# Patient Record
Sex: Female | Born: 1945 | Race: Black or African American | Hispanic: No
Health system: Southern US, Community
[De-identification: ages and names within clinical notes are randomized; demographics above are authoritative.]

---

## 2015-07-29 ENCOUNTER — Emergency Department
Admission: EM | Admit: 2015-07-29 | Discharge: 2015-07-29 | Disposition: A | Discharge status: Home / Self Care | Payer: Medicare Other | Attending: Emergency Medicine | Admitting: Emergency Medicine

## 2015-07-29 ENCOUNTER — Emergency Department: Payer: Medicare Other

## 2015-07-29 DIAGNOSIS — Y999 Unspecified external cause status: Secondary | ICD-10-CM | POA: Insufficient documentation

## 2015-07-29 DIAGNOSIS — W108XXA Fall (on) (from) other stairs and steps, initial encounter: Secondary | ICD-10-CM | POA: Insufficient documentation

## 2015-07-29 DIAGNOSIS — S20211A Contusion of right front wall of thorax, initial encounter: Secondary | ICD-10-CM | POA: Insufficient documentation

## 2015-07-29 DIAGNOSIS — S20229A Contusion of unspecified back wall of thorax, initial encounter: Secondary | ICD-10-CM | POA: Insufficient documentation

## 2015-07-29 DIAGNOSIS — S299XXA Unspecified injury of thorax, initial encounter: Secondary | ICD-10-CM | POA: Diagnosis present

## 2015-07-29 DIAGNOSIS — Y929 Unspecified place or not applicable: Secondary | ICD-10-CM | POA: Insufficient documentation

## 2015-07-29 DIAGNOSIS — S20219A Contusion of unspecified front wall of thorax, initial encounter: Secondary | ICD-10-CM

## 2015-07-29 DIAGNOSIS — S20212A Contusion of left front wall of thorax, initial encounter: Secondary | ICD-10-CM | POA: Diagnosis not present

## 2015-07-29 DIAGNOSIS — Y9301 Activity, walking, marching and hiking: Secondary | ICD-10-CM | POA: Insufficient documentation

## 2015-07-29 MED ORDER — MELOXICAM 7.5 MG PO TABS: 7.5000 mg | ORAL_TABLET | Freq: Every day | ORAL | Status: AC

## 2015-07-29 MED ORDER — METHOCARBAMOL 500 MG PO TABS: 500.0000 mg | ORAL_TABLET | Freq: Four times a day (QID) | ORAL | Status: AC | PRN

## 2015-07-29 MED ORDER — HYDROCODONE-ACETAMINOPHEN 5-325 MG PO TABS
1.0000 | ORAL_TABLET | Freq: Once | ORAL | Status: AC
  Administered 2015-07-29: 1 via ORAL
  Filled 2015-07-29: qty 1

## 2015-07-29 MED ORDER — METHOCARBAMOL 500 MG PO TABS
500.0000 mg | ORAL_TABLET | Freq: Once | ORAL | Status: AC
  Administered 2015-07-29: 500 mg via ORAL
  Filled 2015-07-29: qty 1

## 2015-07-29 MED ORDER — MELOXICAM 7.5 MG PO TABS
7.5000 mg | ORAL_TABLET | Freq: Once | ORAL | Status: AC
  Administered 2015-07-29: 7.5 mg via ORAL
  Filled 2015-07-29: qty 1

## 2015-07-29 NOTE — Discharge Instructions (Signed)
Contusion A contusion is a deep bruise. Contusions happen when an injury causes bleeding under the skin. Symptoms of bruising include pain, swelling, and discolored skin. The skin may turn blue, purple, or yellow. HOME CARE   Rest the injured area.  If told, put ice on the injured area.  Put ice in a plastic bag.  Place a towel between your skin and the bag.  Leave the ice on for 20 minutes, 2-3 times per day.  If told, put light pressure (compression) on the injured area using an elastic bandage. Make sure the bandage is not too tight. Remove it and put it back on as told by your doctor.  If possible, raise (elevate) the injured area above the level of your heart while you are sitting or lying down.  Take over-the-counter and prescription medicines only as told by your doctor. GET HELP IF:  Your symptoms do not get better after several days of treatment.  Your symptoms get worse.  You have trouble moving the injured area. GET HELP RIGHT AWAY IF:   You have very bad pain.  You have a loss of feeling (numbness) in a hand or foot.  Your hand or foot turns pale or cold.   This information is not intended to replace advice given to you by your health care provider. Make sure you discuss any questions you have with your health care provider.   Document Released: 06/30/2007 Document Revised: 10/02/2014 Document Reviewed: 05/29/2014 Elsevier Interactive Patient Education 2016 [employer].  Rib Contusion A rib contusion is a deep bruise on your rib area. Contusions are the result of a blunt trauma that causes bleeding and injury to the tissues under the skin. A rib contusion may involve bruising of the ribs and of the skin and muscles in the area. The skin overlying the contusion may turn blue, purple, or yellow. Minor injuries will give you a painless contusion, but more severe contusions may stay painful and swollen for a few weeks. CAUSES  A contusion is usually caused by a  blow, trauma, or direct force to an area of the body. This often occurs while playing contact sports. SYMPTOMS  Swelling and redness of the injured area.  Discoloration of the injured area.  Tenderness and soreness of the injured area.  Pain with or without movement. DIAGNOSIS  The diagnosis can be made by taking a medical history and performing a physical exam. An X-ray, CT scan, or MRI may be needed to determine if there were any associated injuries, such as broken bones (fractures) or internal injuries. TREATMENT  Often, the best treatment for a rib contusion is rest. Icing or applying cold compresses to the injured area may help reduce swelling and inflammation. Deep breathing exercises may be recommended to reduce the risk of partial lung collapse and pneumonia. Over-the-counter or prescription medicines may also be recommended for pain control. HOME CARE INSTRUCTIONS   Apply ice to the injured area:  Put ice in a plastic bag.  Place a towel between your skin and the bag.  Leave the ice on for 20 minutes, 2-3 times per day.  Take medicines only as directed by your health care provider.  Rest the injured area. Avoid strenuous activity and any activities or movements that cause pain. Be careful during activities and avoid bumping the injured area.  Perform deep-breathing exercises as directed by your health care provider.  Do not lift anything that is heavier than 5 lb (2.3 kg) until your health care  provider approves. °· Do not use any tobacco products, including cigarettes, chewing tobacco, or electronic cigarettes. If you need help quitting, ask your health care provider. °SEEK MEDICAL CARE IF:  °· You have increased bruising or swelling. °· You have pain that is not controlled with treatment. °· You have a fever. °SEEK IMMEDIATE MEDICAL CARE IF:  °· You have difficulty breathing or shortness of breath. °· You develop a continual cough, or you cough up thick or bloody  sputum. °· You feel sick to your stomach (nauseous), you throw up (vomit), or you have abdominal pain. °  °This information is not intended to replace advice given to you by your health care provider. Make sure you discuss any questions you have with your health care provider. °  °Document Released: 10/06/2000 Document Revised: 02/01/2014 Document Reviewed: 10/23/2013 °Elsevier Interactive Patient Education ©2016 [employer] ° °

## 2015-07-29 NOTE — ED Provider Notes (Signed)
[facility] Emergency Department Provider Note  ____________________________________________  Time seen: Approximately 9:06 PM  I have reviewed the triage vital signs and the nursing notes.   HISTORY  Chief Complaint Fall    HPI Tina Potts is a 70 y.o. female who presents to emergency department complaining of back and rib pain status post fall. Patient states that she was walking down a flight of steps when she slipped and fell backwards landing on her back. Patient states that she slipped down approximately 5 steps. Patient was able to rise up and walk after injury. Patient is continuing to have pain in the mid back into the rib region. Patient states it is painful with deep inspiration or movement. Pain is moderate to severe, constant, sharp. Patient has a history of previous back surgeries with hardware in place. Patient did not hit her head or lose consciousness. She denies any headache, visual changes, neck pain, chest pain, shortness of breath, abdominal pain, nausea or vomiting.   No past medical history on file.  There are no active problems to display for this patient.   No past surgical history on file.  Current Outpatient Rx  Name  Route  Sig  Dispense  Refill  . meloxicam (MOBIC) 7.5 MG tablet   Oral   Take 1 tablet (7.5 mg total) by mouth daily.   30 tablet   0   . methocarbamol (ROBAXIN) 500 MG tablet   Oral   Take 1 tablet (500 mg total) by mouth every 6 (six) hours as needed for muscle spasms.   16 tablet   0     Allergies Stadol and Talwin  No family history on file.  Social History Social History  Substance Use Topics  . Smoking status: Not on file  . Smokeless tobacco: Not on file  . Alcohol Use: Not on file     Review of Systems  Constitutional: No fever/chills Cardiovascular: no chest pain. Respiratory: no cough. No SOB. Gastrointestinal: No abdominal pain.  No nausea, no vomiting.   Musculoskeletal:  Positive for back and bilateral rib pain Skin: Negative for rash, abrasions, lacerations, ecchymosis. Neurological: Negative for headaches, focal weakness or numbness. 10-point ROS otherwise negative.  ____________________________________________   PHYSICAL EXAM:  VITAL SIGNS: ED Triage Vitals  Enc Vitals Group     BP 07/29/15 2038 122/56 mmHg     Pulse Rate 07/29/15 2038 97     Resp 07/29/15 2038 18     Temp 07/29/15 2038 99.4 F (37.4 C)     Temp Source 07/29/15 2038 Oral     SpO2 07/29/15 2038 98 %     Weight 07/29/15 2038 217 lb (98.431 kg)     Height 07/29/15 2038 5\' 5"  (1.651 m)     Head Cir --      Peak Flow --      Pain Score 07/29/15 2039 8     Pain Loc --      Pain Edu? --      Excl. in GC? --      Constitutional: Alert and oriented. Well appearing and in no acute distress. Eyes: Conjunctivae are normal. PERRL. EOMI. Head: Atraumatic. Neck: No stridor.  No cervical spine tenderness to palpation.  Cardiovascular: Normal rate, regular rhythm. Normal S1 and S2.  Good peripheral circulation. Respiratory: Normal respiratory effort without tachypnea or retractions. Lungs CTAB. Good air entry to the bases with no decreased or absent breath sounds. Musculoskeletal: Full range of motion to all extremities.  No gross deformities appreciated. No deformities to reopen inspection. No paradoxical chest wall movement. No flail segments. Patient is diffusely tender to palpation in the 6 through 12 rib groups. No palpable abnormality. Patient is tender to palpation midline spinal processes in the mid thoracic to upper lumbar region. No palpable abnormality. Full range of motion all extremities. Sensation intact all extremities. Pulses intact 4 extremities. Neurologic:  Normal speech and language. No gross focal neurologic deficits are appreciated.  Skin:  Skin is warm, dry and intact. No rash noted. Psychiatric: Mood and affect are normal. Speech and behavior are normal. Patient  exhibits appropriate insight and judgement.   ____________________________________________   LABS (all labs ordered are listed, but only abnormal results are displayed)  Labs Reviewed - No data to display ____________________________________________  EKG   ____________________________________________  RADIOLOGY Festus Barren Violette Morneault, personally viewed and evaluated these images (plain radiographs) as part of my medical decision making, as well as reviewing the written report by the radiologist.  Dg Chest 2 View  07/29/2015  CLINICAL DATA:  Pain following fall EXAM: CHEST  2 VIEW COMPARISON:  None. FINDINGS: There is slight atelectasis in the left base. Lungs elsewhere clear. Heart size and pulmonary vascularity are normal. There is atherosclerotic calcification in the aortic arch. Port-A-Cath tip is in the superior vena cava. No pneumothorax. No adenopathy evident. There is a surgical clip overlying the lower left neck region. Postoperative changes noted in the visualized lumbar region. There are surgical clips in the right upper quadrant of the abdomen. No acute fracture evident. IMPRESSION: Slight left base atelectasis. No edema or consolidation. Cardiac silhouette within normal limits. There is aortic atherosclerosis. No adenopathy evident. Electronically Signed   By: Bretta Bang III M.D.   On: 07/29/2015 21:35   Dg Lumbar Spine Complete  07/29/2015  CLINICAL DATA:  Pain following fall down steps EXAM: LUMBAR SPINE - COMPLETE 4+ VIEW COMPARISON:  None. FINDINGS: Frontal, lateral, spot lumbosacral lateral, and bilateral oblique views were obtained. There are 5 non-rib-bearing lumbar type vertebral bodies. The patient has undergone multiple laminectomies extending from L2 through L5. There are pedicle screws at L1, L2, L3, L4, L5, and S1. There is plate fixation bilaterally from L3-S1 as well. The tips of all screws are in the respective vertebral bodies. There is no fracture or  spondylolisthesis. There is moderate disc space narrowing at T12-L1, L2-3, and L3-4. There is milder disc space narrowing at L1-2 and L4-5. There is facet osteoarthritic change at all levels bilaterally. There are soft tissue calcifications in the buttocks region on the right. IMPRESSION: Extensive postoperative change. Multilevel osteoarthritic change. No acute fracture or spondylolisthesis. Electronically Signed   By: Bretta Bang III M.D.   On: 07/29/2015 21:33    ____________________________________________    PROCEDURES  Procedure(s) performed:       Medications  methocarbamol (ROBAXIN) tablet 500 mg (not administered)  meloxicam (MOBIC) tablet 7.5 mg (not administered)  HYDROcodone-acetaminophen (NORCO/VICODIN) 5-325 MG per tablet 1 tablet (1 tablet Oral Given 07/29/15 2133)     ____________________________________________   INITIAL IMPRESSION / ASSESSMENT AND PLAN / ED COURSE  Pertinent labs & imaging results that were available during my care of the patient were reviewed by me and considered in my medical decision making (see chart for details).  Patient's diagnosis is consistent with A fall on the stairs resulting in rib and back contusion. X-ray reveals no acute osseous abnormality. Exam is reassuring.. Patient will be discharged home with prescriptions for anti-inflammatories  and muscle relaxer. Patient is given strict instructions on use of muscle relaxer. Patient's family states that they will be with her and monitor her. If she has drowsiness or sluggishness she will only take at nighttime. Patient states that she is able to take anti-inflammatories as she has no history of chronic kidney disease.. Patient is to follow up with primary care provider as needed or otherwise directed. Patient is given ED precautions to return to the ED for any worsening or new symptoms.     ____________________________________________  FINAL CLINICAL IMPRESSION(S) / ED  DIAGNOSES  Final diagnoses:  Fall (on) (from) other stairs and steps, initial encounter  Contusion of rib, unspecified laterality, initial encounter  Back contusion, unspecified laterality, initial encounter      NEW MEDICATIONS STARTED DURING THIS VISIT:  New Prescriptions   MELOXICAM (MOBIC) 7.5 MG TABLET    Take 1 tablet (7.5 mg total) by mouth daily.   METHOCARBAMOL (ROBAXIN) 500 MG TABLET    Take 1 tablet (500 mg total) by mouth every 6 (six) hours as needed for muscle spasms.        This chart was dictated using voice recognition software/Dragon. Despite best efforts to proofread, errors can occur which can change the meaning. Any change was purely unintentional.    Racheal PatchesJonathan D Sierrah Luevano, PA-C 07/29/15 2159  Jeanmarie PlantJames A McShane, MD 07/29/15 [id number]

## 2015-07-29 NOTE — ED Notes (Signed)
Pt tripped and fell down 5 steps did get up and walk afterwards.  Is having mid back pain radiating into ribs bilat and more when she takes a deep breath or move.

## 2015-07-29 NOTE — ED Notes (Signed)
Pt transported to xray via stretcher

## 2017-10-29 IMAGING — CR DG CHEST 2V
1 series · 2 of 2 positions shown · non-contrast
Comparison: None.

CLINICAL DATA: Pain following fall

EXAM:
CHEST  2 VIEW

[Series 1: dg chest 2 view · 0.14mm/px · 2 of 2 slices shown]
[im 1/2]
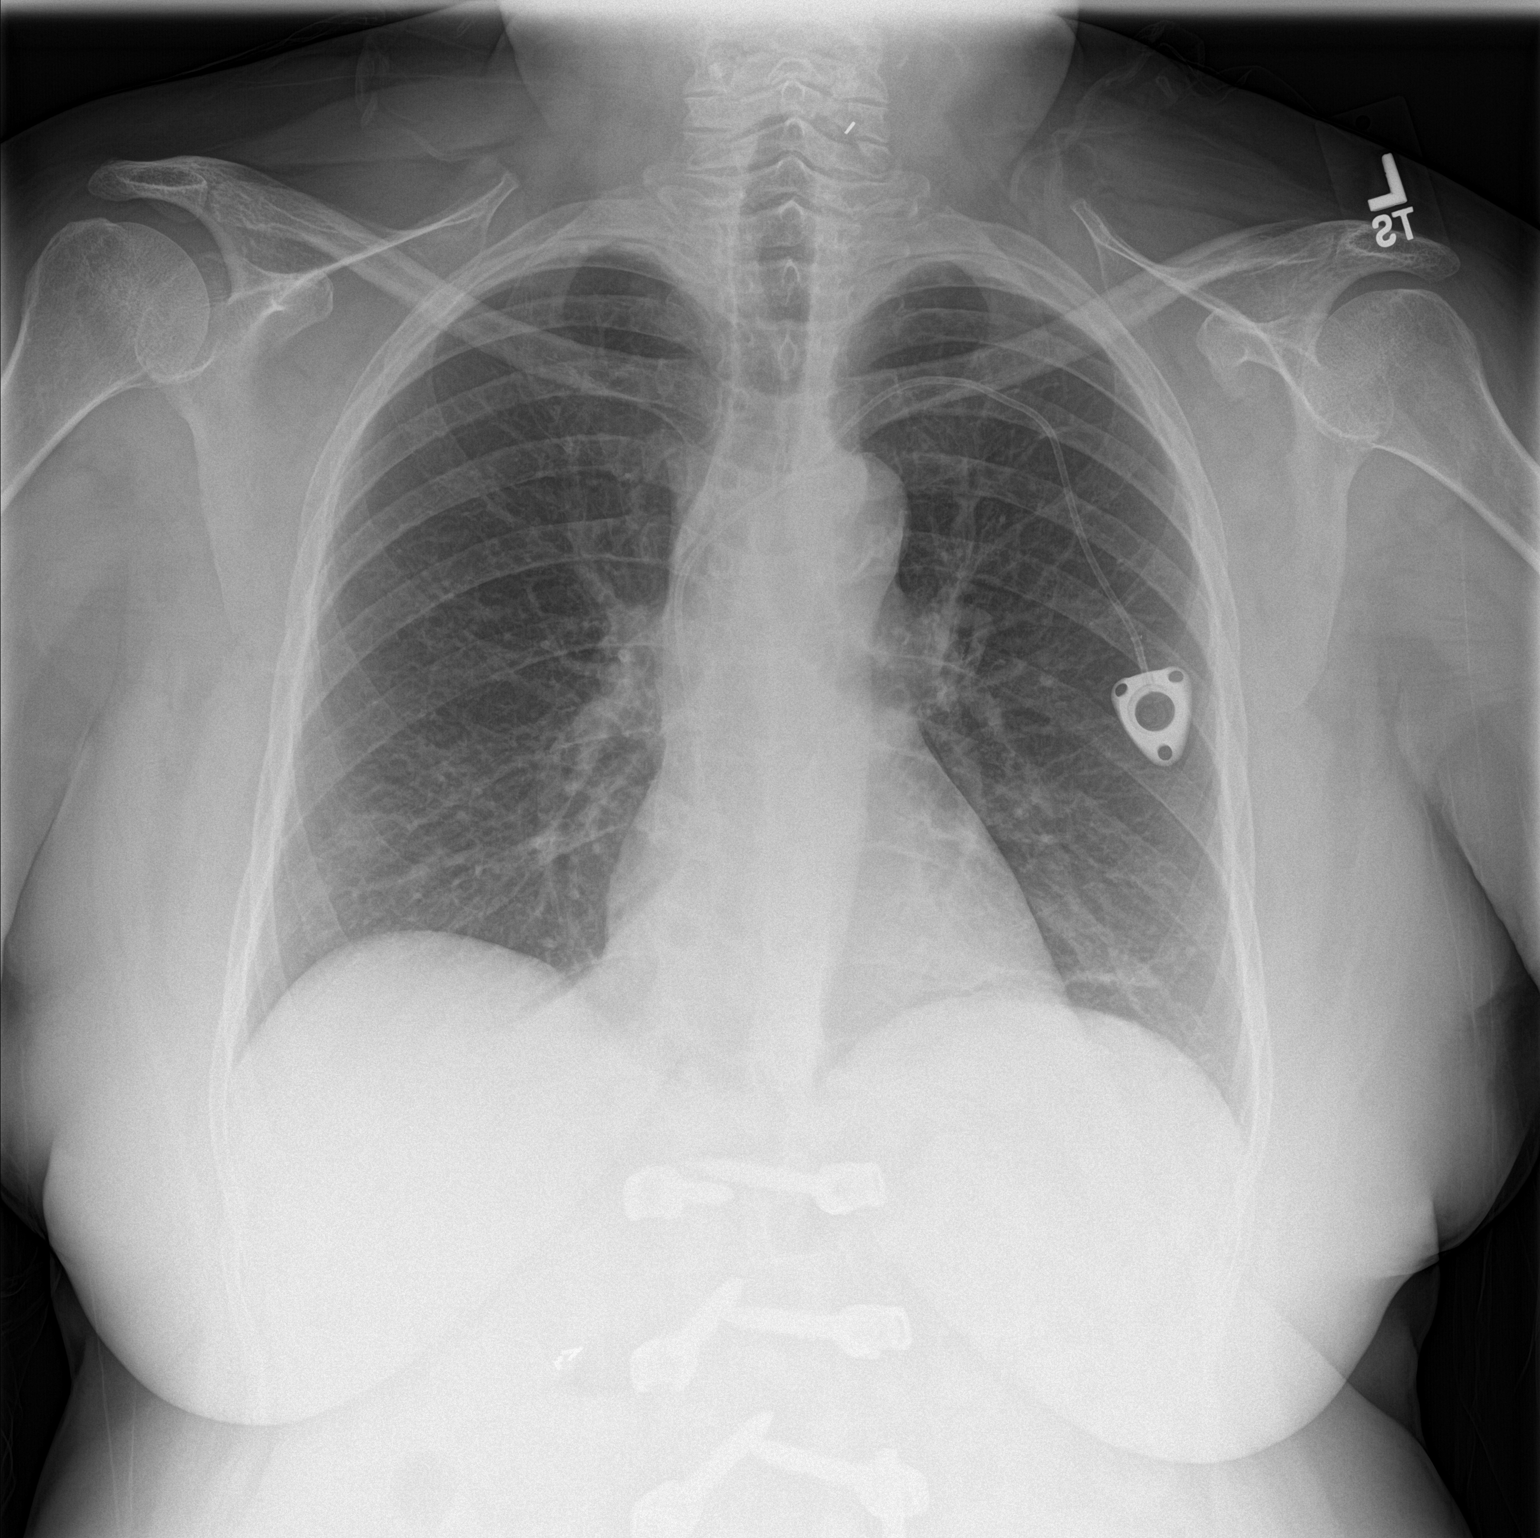
[im 2/2]
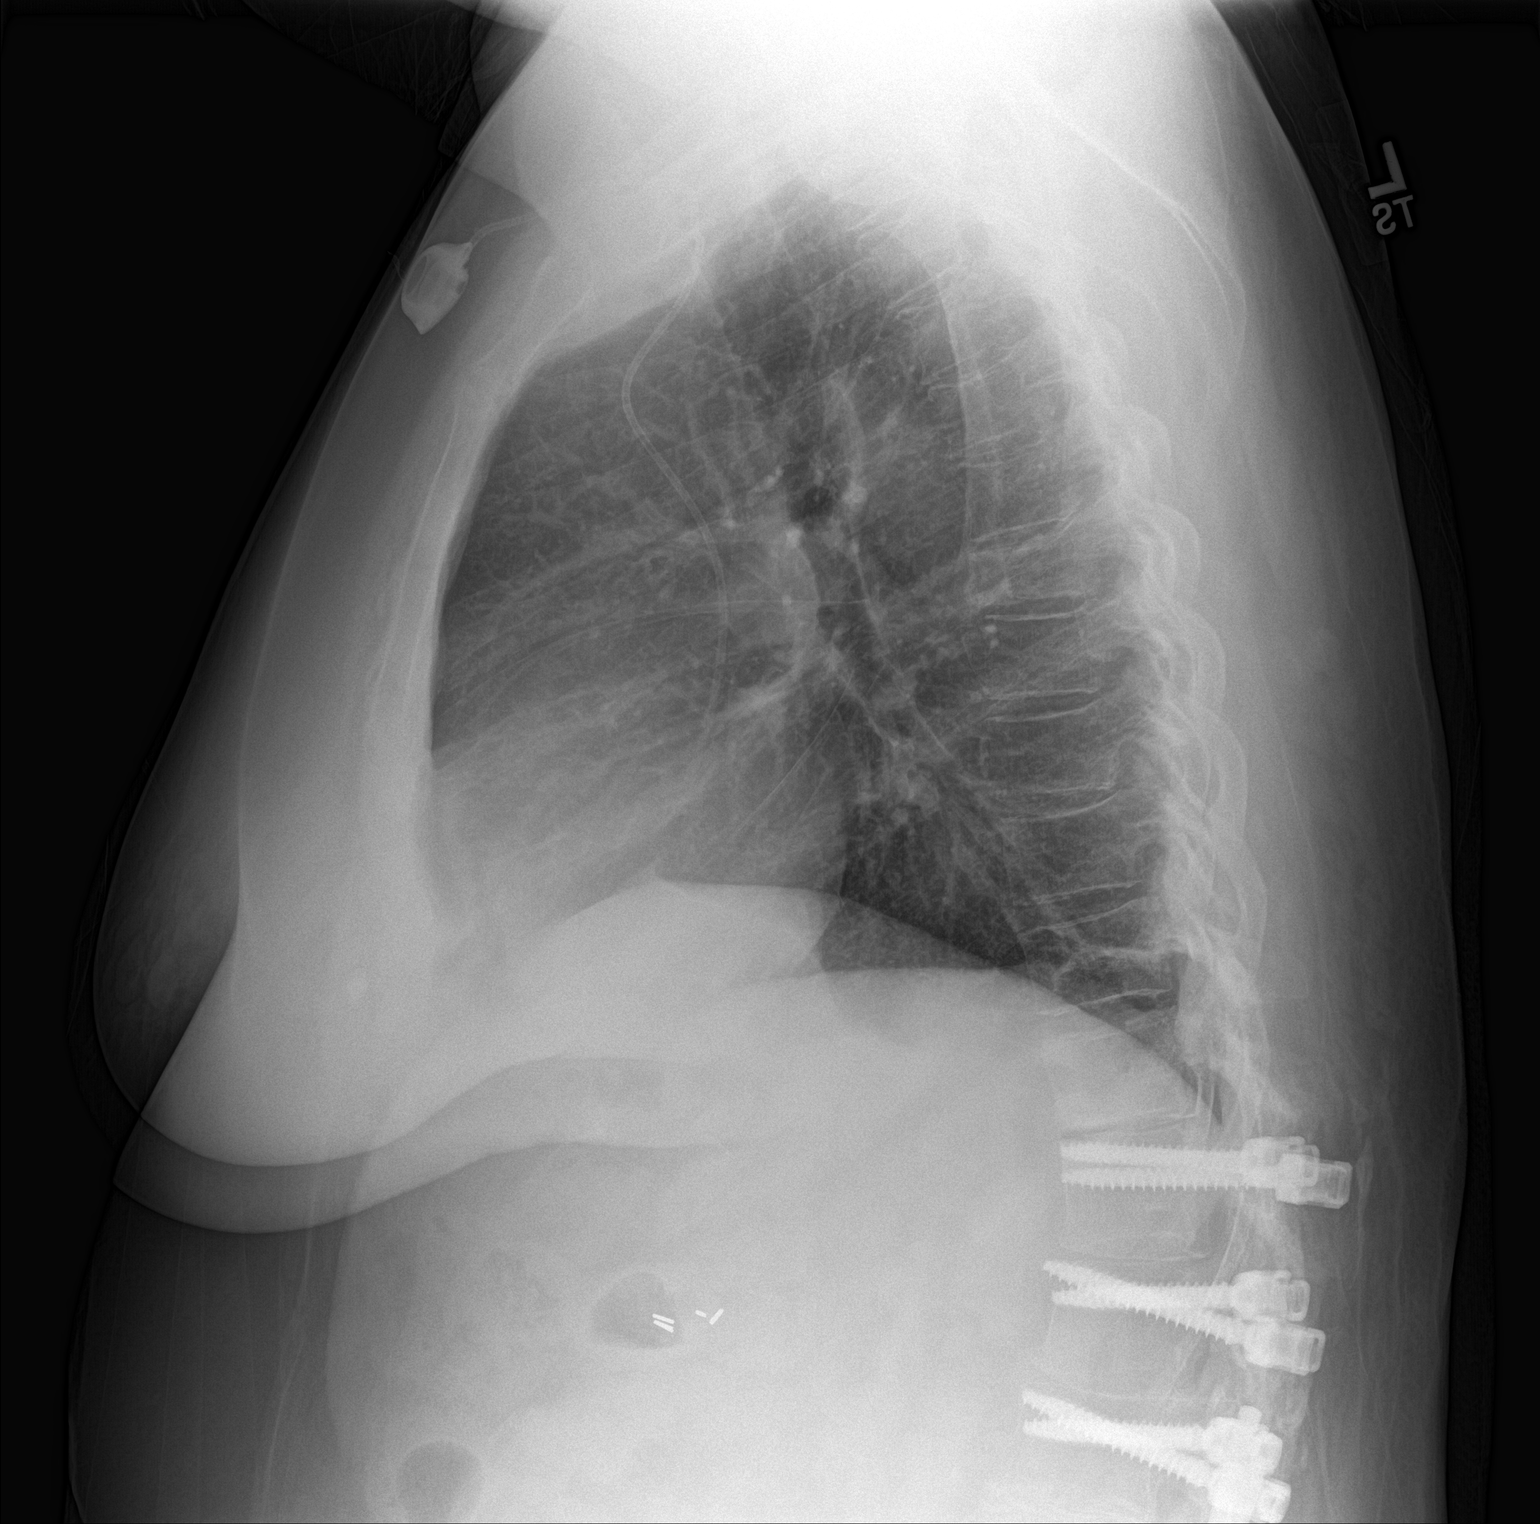

[2 of 2 positions shown; findings below may reference images not displayed]

FINDINGS: There is slight atelectasis in the left base. Lungs elsewhere clear.
Heart size and pulmonary vascularity are normal. There is
atherosclerotic calcification in the aortic arch. Port-A-Cath tip is
in the superior vena cava. No pneumothorax. No adenopathy evident.
There is a surgical clip overlying the lower left neck region.
Postoperative changes noted in the visualized lumbar region. There
are surgical clips in the right upper quadrant of the abdomen. No
acute fracture evident.
IMPRESSION: Slight left base atelectasis. No edema or consolidation. Cardiac
silhouette within normal limits. There is aortic atherosclerosis. No
adenopathy evident.
